# Patient Record
Sex: Female | Born: 1955 | Race: White | Hispanic: No | Marital: Married | Smoking: Never smoker
Health system: Southern US, Community
[De-identification: ages and names within clinical notes are randomized; demographics above are authoritative.]

---

## 2000-01-02 ENCOUNTER — Other Ambulatory Visit: Admission: RE | Admit: 2000-01-02 | Discharge: 2000-01-02 | Payer: Self-pay | Admitting: Family Medicine

## 2000-02-02 ENCOUNTER — Encounter: Admission: RE | Admit: 2000-02-02 | Discharge: 2000-02-02 | Payer: Self-pay | Admitting: Family Medicine

## 2000-02-02 ENCOUNTER — Encounter: Payer: Self-pay | Admitting: Family Medicine

## 2001-01-03 ENCOUNTER — Other Ambulatory Visit: Admission: RE | Admit: 2001-01-03 | Discharge: 2001-01-03 | Payer: Self-pay | Admitting: Family Medicine

## 2001-04-04 ENCOUNTER — Encounter: Payer: Self-pay | Admitting: Family Medicine

## 2001-04-04 ENCOUNTER — Encounter: Admission: RE | Admit: 2001-04-04 | Discharge: 2001-04-04 | Payer: Self-pay | Admitting: Family Medicine

## 2002-10-20 ENCOUNTER — Encounter: Admission: RE | Admit: 2002-10-20 | Discharge: 2002-10-20 | Payer: Self-pay | Admitting: Family Medicine

## 2002-10-20 ENCOUNTER — Encounter: Payer: Self-pay | Admitting: Family Medicine

## 2002-11-02 ENCOUNTER — Other Ambulatory Visit: Admission: RE | Admit: 2002-11-02 | Discharge: 2002-11-02 | Payer: Self-pay | Admitting: Family Medicine

## 2003-11-09 ENCOUNTER — Other Ambulatory Visit: Admission: RE | Admit: 2003-11-09 | Discharge: 2003-11-09 | Payer: Self-pay | Admitting: Family Medicine

## 2003-11-10 ENCOUNTER — Encounter: Admission: RE | Admit: 2003-11-10 | Discharge: 2003-11-10 | Payer: Self-pay | Admitting: Family Medicine

## 2003-12-24 ENCOUNTER — Encounter: Admission: RE | Admit: 2003-12-24 | Discharge: 2003-12-24 | Payer: Self-pay | Admitting: Family Medicine

## 2003-12-31 ENCOUNTER — Encounter: Admission: RE | Admit: 2003-12-31 | Discharge: 2003-12-31 | Payer: Self-pay | Admitting: Family Medicine

## 2004-03-03 ENCOUNTER — Ambulatory Visit (HOSPITAL_COMMUNITY): Admission: RE | Admit: 2004-03-03 | Discharge: 2004-03-03 | Payer: Self-pay | Admitting: Obstetrics & Gynecology

## 2004-03-03 ENCOUNTER — Encounter (INDEPENDENT_AMBULATORY_CARE_PROVIDER_SITE_OTHER): Payer: Self-pay | Admitting: *Deleted

## 2005-03-20 ENCOUNTER — Encounter: Admission: RE | Admit: 2005-03-20 | Discharge: 2005-03-20 | Payer: Self-pay | Admitting: Obstetrics & Gynecology

## 2005-07-30 ENCOUNTER — Other Ambulatory Visit: Admission: RE | Admit: 2005-07-30 | Discharge: 2005-07-30 | Payer: Self-pay | Admitting: Family Medicine

## 2006-05-29 ENCOUNTER — Encounter: Admission: RE | Admit: 2006-05-29 | Discharge: 2006-05-29 | Payer: Self-pay | Admitting: Family Medicine

## 2006-10-18 ENCOUNTER — Encounter: Admission: RE | Admit: 2006-10-18 | Discharge: 2006-10-18 | Payer: Self-pay | Admitting: Family Medicine

## 2007-02-27 ENCOUNTER — Other Ambulatory Visit: Admission: RE | Admit: 2007-02-27 | Discharge: 2007-02-27 | Payer: Self-pay | Admitting: Family Medicine

## 2007-07-03 ENCOUNTER — Encounter: Admission: RE | Admit: 2007-07-03 | Discharge: 2007-07-03 | Payer: Self-pay | Admitting: Family Medicine

## 2008-03-09 ENCOUNTER — Encounter: Admission: RE | Admit: 2008-03-09 | Payer: Self-pay | Admitting: Obstetrics & Gynecology

## 2008-03-09 ENCOUNTER — Encounter (INDEPENDENT_AMBULATORY_CARE_PROVIDER_SITE_OTHER): Payer: Self-pay | Admitting: Diagnostic Radiology

## 2010-04-26 ENCOUNTER — Other Ambulatory Visit: Admission: RE | Admit: 2010-04-26 | Discharge: 2010-04-26 | Payer: Self-pay | Admitting: Family Medicine

## 2010-08-05 ENCOUNTER — Encounter: Payer: Self-pay | Admitting: Family Medicine

## 2010-12-01 NOTE — Op Note (Signed)
NAME:  Lorraine Johnson, Lorraine Johnson                        ACCOUNT NO.:  [id number]   MEDICAL RECORD NO.:  [id number]                   PATIENT TYPE:  AMB   LOCATION:  [facility]C                                  FACILITY:  [facility]   PHYSICIAN:  Genia Del, M.D.             DATE OF BIRTH:  18-Jan-1956   DATE OF PROCEDURE:  03/03/2004  DATE OF DISCHARGE:                                 OPERATIVE REPORT   PREOPERATIVE DIAGNOSES:  Menorrhagia with intrauterine lesion.   POSTOPERATIVE DIAGNOSES:  1. Menorrhagia with intrauterine lesion.  2. Confirmed intrauterine lesion.  3. Probable endometrial polyp.   INTERVENTION:  Hysteroscopy with resection of intrauterine lesion,  dilatation and curettage and Novasure ablation of the endometrium.   SURGEON:  Genia Del, M.D.   ANESTHESIOLOGIST:  Raul Del, M.D.   DESCRIPTION OF PROCEDURE:  Under MAC analgesia, the patient is in lithotomy  position. She is prepped with Hibiclens on the suprapubic, vulvar and  vaginal area and draped as usual. The vaginal exam reveals and anteverted  uterus, normal volume and mobile, no adnexal mass. The speculum is entered  in the vagina, the anterior lip of the cervix is grasped with a tenaculum.  We proceed with a paracervical block with Nesacaine 1%, 20 mL total at 4 and  8 o'clock. We then make the measurements of the cervical canal and the  uterine cavity. It comes to an intrauterine cavity length of 5 cm. We then  dilate the cervix at Hegar #31 without difficulty. We introduce the  resectoscope in the intrauterine cavity with a loop. We visualize the  intrauterine cavity. A polyp measuring a little bit less than 2 cm is  present on the right posterior aspect of the uterine cavity. We resected by  cauterizing and cutting the stump of the polyp. It is removed in one piece  and sent to pathology. Hemostasis is adequate. We visualize the two ostia,  pictures are taken. We then removed the hysteroscope. We  proceed with  curettage of the endometrial cavity with a sharp curette and sent the  specimen to pathology. We remove the curette and proceed with the Novasure  ablation of the endometrium. The measurements are completed for the width  which is at 4.3 cm.  We proceed with power of 118 for 65 seconds after doing  the testing for the integrity of the cavity which has passed.  Hemostasis is  adequate at end of the intervention, all instruments are removed. The  estimated blood loss was minimal.  No complication occurred and the patient  was transferred to the recovery room in good status.                                               Genia Del, M.D.    ML/MEDQ  D:  03/03/2004  T:  03/04/2004  Job:  [id number]

## 2011-08-01 ENCOUNTER — Other Ambulatory Visit: Payer: Self-pay | Admitting: Family Medicine

## 2011-08-01 DIAGNOSIS — Z1231 Encounter for screening mammogram for malignant neoplasm of breast: Secondary | ICD-10-CM

## 2011-08-07 ENCOUNTER — Ambulatory Visit
Admission: RE | Admit: 2011-08-07 | Discharge: 2011-08-07 | Disposition: A | Discharge status: Home / Self Care | Payer: BC Managed Care – PPO | Source: Ambulatory Visit | Attending: Family Medicine | Admitting: Family Medicine

## 2011-08-07 DIAGNOSIS — Z1231 Encounter for screening mammogram for malignant neoplasm of breast: Secondary | ICD-10-CM

## 2012-10-28 ENCOUNTER — Other Ambulatory Visit: Payer: Self-pay

## 2012-10-28 DIAGNOSIS — Z1231 Encounter for screening mammogram for malignant neoplasm of breast: Secondary | ICD-10-CM

## 2012-11-19 ENCOUNTER — Ambulatory Visit: Payer: BC Managed Care – PPO

## 2012-12-09 ENCOUNTER — Ambulatory Visit
Admission: RE | Admit: 2012-12-09 | Discharge: 2012-12-09 | Disposition: A | Discharge status: Home / Self Care | Payer: BC Managed Care – PPO | Source: Ambulatory Visit

## 2012-12-09 DIAGNOSIS — Z1231 Encounter for screening mammogram for malignant neoplasm of breast: Secondary | ICD-10-CM

## 2013-08-12 ENCOUNTER — Other Ambulatory Visit (HOSPITAL_COMMUNITY)
Admission: RE | Admit: 2013-08-12 | Discharge: 2013-08-12 | Disposition: A | Discharge status: Home / Self Care | Payer: BC Managed Care – PPO | Source: Ambulatory Visit | Attending: Family Medicine | Admitting: Family Medicine

## 2013-08-12 ENCOUNTER — Other Ambulatory Visit: Payer: Self-pay | Admitting: Family Medicine

## 2013-08-12 DIAGNOSIS — Z124 Encounter for screening for malignant neoplasm of cervix: Secondary | ICD-10-CM | POA: Insufficient documentation

## 2015-06-06 ENCOUNTER — Telehealth: Payer: Self-pay | Admitting: Cardiovascular Disease

## 2015-06-06 ENCOUNTER — Encounter (HOSPITAL_COMMUNITY): Payer: Self-pay | Admitting: Emergency Medicine

## 2015-06-06 ENCOUNTER — Emergency Department (HOSPITAL_COMMUNITY): Payer: BLUE CROSS/BLUE SHIELD

## 2015-06-06 ENCOUNTER — Emergency Department (HOSPITAL_COMMUNITY)
Admission: EM | Admit: 2015-06-06 | Discharge: 2015-06-06 | Disposition: A | Discharge status: Home / Self Care | Payer: BLUE CROSS/BLUE SHIELD | Attending: Emergency Medicine | Admitting: Emergency Medicine

## 2015-06-06 DIAGNOSIS — R630 Anorexia: Secondary | ICD-10-CM | POA: Diagnosis not present

## 2015-06-06 DIAGNOSIS — G43809 Other migraine, not intractable, without status migrainosus: Secondary | ICD-10-CM | POA: Insufficient documentation

## 2015-06-06 DIAGNOSIS — R509 Fever, unspecified: Secondary | ICD-10-CM | POA: Diagnosis not present

## 2015-06-06 DIAGNOSIS — H53149 Visual discomfort, unspecified: Secondary | ICD-10-CM | POA: Diagnosis not present

## 2015-06-06 DIAGNOSIS — R112 Nausea with vomiting, unspecified: Secondary | ICD-10-CM | POA: Diagnosis present

## 2015-06-06 LAB — CBC WITH DIFFERENTIAL/PLATELET
Basophils Absolute: 0 10*3/uL (ref 0.0–0.1)
Basophils Relative: 0 %
Eosinophils Absolute: 0 10*3/uL (ref 0.0–0.7)
Eosinophils Relative: 0 %
HCT: 42.9 % (ref 36.0–46.0)
HEMOGLOBIN: 14.6 g/dL (ref 12.0–15.0)
LYMPHS ABS: 1 10*3/uL (ref 0.7–4.0)
LYMPHS PCT: 11 %
MCH: 29.6 pg (ref 26.0–34.0)
MCHC: 34 g/dL (ref 30.0–36.0)
MCV: 86.8 fL (ref 78.0–100.0)
Monocytes Absolute: 0.3 10*3/uL (ref 0.1–1.0)
Monocytes Relative: 3 %
NEUTROS ABS: 8.6 10*3/uL — AB (ref 1.7–7.7)
NEUTROS PCT: 86 %
Platelets: 357 10*3/uL (ref 150–400)
RBC: 4.94 MIL/uL (ref 3.87–5.11)
RDW: 12.1 % (ref 11.5–15.5)
WBC: 10 10*3/uL (ref 4.0–10.5)

## 2015-06-06 LAB — BASIC METABOLIC PANEL
Anion gap: 10 (ref 5–15)
BUN: 14 mg/dL (ref 6–20)
CHLORIDE: 100 mmol/L — AB (ref 101–111)
CO2: 24 mmol/L (ref 22–32)
Calcium: 9.5 mg/dL (ref 8.9–10.3)
Creatinine, Ser: 0.87 mg/dL (ref 0.44–1.00)
GFR calc Af Amer: >60 mL/min (ref >60)
GFR calc non Af Amer: >60 mL/min (ref >60)
GLUCOSE: 139 mg/dL — AB (ref 65–99)
POTASSIUM: 3.7 mmol/L (ref 3.5–5.1)
SODIUM: 134 mmol/L — AB (ref 135–145)

## 2015-06-06 MED ORDER — DIPHENHYDRAMINE HCL 50 MG/ML IJ SOLN
25.0000 mg | Freq: Once | INTRAMUSCULAR | Status: AC
  Administered 2015-06-06: 25 mg via INTRAVENOUS
  Filled 2015-06-06: qty 1

## 2015-06-06 MED ORDER — PREDNISONE 20 MG PO TABS: 60.0000 mg | ORAL_TABLET | Freq: Every day | ORAL | Status: AC

## 2015-06-06 MED ORDER — ONDANSETRON HCL 4 MG/2ML IJ SOLN
4.0000 mg | Freq: Once | INTRAMUSCULAR | Status: AC
  Administered 2015-06-06: 4 mg via INTRAVENOUS
  Filled 2015-06-06: qty 2

## 2015-06-06 MED ORDER — SODIUM CHLORIDE 0.9 % IV BOLUS (SEPSIS)
1000.0000 mL | Freq: Once | INTRAVENOUS | Status: AC
  Administered 2015-06-06: 1000 mL via INTRAVENOUS

## 2015-06-06 MED ORDER — PROCHLORPERAZINE EDISYLATE 5 MG/ML IJ SOLN
10.0000 mg | Freq: Four times a day (QID) | INTRAMUSCULAR | Status: DC | PRN
  Administered 2015-06-06: 10 mg via INTRAVENOUS
  Filled 2015-06-06: qty 2

## 2015-06-06 MED ORDER — ONDANSETRON 4 MG PO TBDP: 4.0000 mg | ORAL_TABLET | Freq: Three times a day (TID) | ORAL | Status: AC | PRN

## 2015-06-06 MED ORDER — DEXAMETHASONE SODIUM PHOSPHATE 10 MG/ML IJ SOLN
10.0000 mg | Freq: Once | INTRAMUSCULAR | Status: AC
  Administered 2015-06-06: 10 mg via INTRAVENOUS
  Filled 2015-06-06: qty 1

## 2015-06-06 NOTE — Discharge Instructions (Signed)
Follow up with your family doctor in the next couple days for further testing.  Follow up with your eye doctor for evaluation for possible temporal arteritis.  Migraine Headache A migraine headache is an intense, throbbing pain on one or both sides of your head. A migraine can last for 30 minutes to several hours. CAUSES  The exact cause of a migraine headache is not always known. However, a migraine may be caused when nerves in the brain become irritated and release chemicals that cause inflammation. This causes pain. Certain things may also trigger migraines, such as:  Alcohol.  Smoking.  Stress.  Menstruation.  Aged cheeses.  Foods or drinks that contain nitrates, glutamate, aspartame, or tyramine.  Lack of sleep.  Chocolate.  Caffeine.  Hunger.  Physical exertion.  Fatigue.  Medicines used to treat chest pain (nitroglycerine), birth control pills, estrogen, and some blood pressure medicines. SIGNS AND SYMPTOMS  Pain on one or both sides of your head.  Pulsating or throbbing pain.  Severe pain that prevents daily activities.  Pain that is aggravated by any physical activity.  Nausea, vomiting, or both.  Dizziness.  Pain with exposure to bright lights, loud noises, or activity.  General sensitivity to bright lights, loud noises, or smells. Before you get a migraine, you may get warning signs that a migraine is coming (aura). An aura may include:  Seeing flashing lights.  Seeing bright spots, halos, or zigzag lines.  Having tunnel vision or blurred vision.  Having feelings of numbness or tingling.  Having trouble talking.  Having muscle weakness. DIAGNOSIS  A migraine headache is often diagnosed based on:  Symptoms.  Physical exam.  A CT scan or MRI of your head. These imaging tests cannot diagnose migraines, but they can help rule out other causes of headaches. TREATMENT Medicines may be given for pain and nausea. Medicines can also be given to  help prevent recurrent migraines.  HOME CARE INSTRUCTIONS  Only take over-the-counter or prescription medicines for pain or discomfort as directed by your health care provider. The use of long-term narcotics is not recommended.  Lie down in a dark, quiet room when you have a migraine.  Keep a journal to find out what may trigger your migraine headaches. For example, write down:  What you eat and drink.  How much sleep you get.  Any change to your diet or medicines.  Limit alcohol consumption.  Quit smoking if you smoke.  Get 7-9 hours of sleep, or as recommended by your health care provider.  Limit stress.  Keep lights dim if bright lights bother you and make your migraines worse. SEEK IMMEDIATE MEDICAL CARE IF:   Your migraine becomes severe.  You have a fever.  You have a stiff neck.  You have vision loss.  You have muscular weakness or loss of muscle control.  You start losing your balance or have trouble walking.  You feel faint or pass out.  You have severe symptoms that are different from your first symptoms. MAKE SURE YOU:   Understand these instructions.  Will watch your condition.  Will get help right away if you are not doing well or get worse.   This information is not intended to replace advice given to you by your health care provider. Make sure you discuss any questions you have with your health care provider.   Document Released: 07/02/2005 Document Revised: 07/23/2014 Document Reviewed: 03/09/2013 Elsevier Interactive Patient Education [employer]

## 2015-06-06 NOTE — ED Notes (Signed)
Pt back from CT

## 2015-06-06 NOTE — ED Notes (Signed)
Pt woke up Sunday morning w/ a headache that she thought was a migraine.  She took medication and tried to sleep the rest of the day however became nauseas and started vomiting.  She has not been able to keep anything down for over 24 hours.  Pain is now a 5 out of 10.

## 2015-06-06 NOTE — ED Provider Notes (Signed)
CSN: [id number]     Arrival date & time 06/06/15  [id number] History   First MD Initiated Contact with Patient 06/06/15 0544     Chief Complaint  Patient presents with  . Headache  . Emesis     (Consider location/radiation/quality/duration/timing/severity/associated sxs/prior Treatment) HPI  This is a 59 year old female who presents with headache and vomiting. Patient reports onset of symptoms yesterday morning. She woke up with a headache behind her right eye. She took a pain pill which "usually knocks it out." She reports persistent pain throughout the day. She developed nonbloody, nonbilious emesis. She has not eaten anything in over 24 hours. Current headache is 5 out of 10. She denies worst headache of her life. Of note, patient reports recent trip to [place]. She states that upon her arrival to [place] 2 weeks ago she had low-grade fevers less than 101, headache, and chest congestion. This lasted for approximately 3 days. She's not had any fevers since her return on Friday. However, she does endorse chills. She denies any neck stiffness or pain. She denies any abdominal pain, chest pain, shortness of breath. She is concerned about [place]ika versus West Nile virus.  History reviewed. No pertinent past medical history. History reviewed. No pertinent past surgical history. No family history on file. Social History  Substance Use Topics  . Smoking status: Never Smoker   . Smokeless tobacco: None  . Alcohol Use: None   OB History    No data available     Review of Systems  Constitutional: Positive for chills. Negative for fever.  Eyes: Positive for photophobia. Negative for visual disturbance.  Respiratory: Negative for cough, chest tightness and shortness of breath.   Cardiovascular: Negative for chest pain.  Gastrointestinal: Positive for nausea and vomiting. Negative for abdominal pain.  Genitourinary: Negative for dysuria.  Musculoskeletal: Negative for back pain.  Neurological: Positive for  headaches. Negative for weakness and numbness.  Psychiatric/Behavioral: Negative for confusion.  All other systems reviewed and are negative.     Allergies  Review of patient's allergies indicates no known allergies.  Home Medications   Prior to Admission medications   Medication Sig Start Date End Date Taking? Authorizing Provider  predniSONE (DELTASONE) 20 MG tablet Take 3 tablets (60 mg total) by mouth daily with breakfast. 06/06/15   Shon Batonourtney F Horton, MD   BP 129/89 mmHg  Pulse 78  Temp(Src) 97.8 F (36.6 C) (Oral)  Resp 16  SpO2 100% Physical Exam  Constitutional: She is oriented to person, place, and time. She appears well-developed and well-nourished. No distress.  Eyes closed  HENT:  Head: Normocephalic and atraumatic.  Eyes: EOM are normal. Pupils are equal, round, and reactive to light.  Neck: Normal range of motion. Neck supple.  No evidence of meningismus  Cardiovascular: Normal rate, regular rhythm and normal heart sounds.   No murmur heard. Pulmonary/Chest: Effort normal and breath sounds normal. No respiratory distress. She has no wheezes.  Abdominal: Soft. Bowel sounds are normal. There is no tenderness. There is no rebound.  Neurological: She is alert and oriented to person, place, and time.  5 out of 5 strength in all 4 extremities, cranial nerves II through XII intact, no dysmetria to finger-nose-finger  Skin: Skin is warm and dry.  Psychiatric: She has a normal mood and affect.  Nursing note and vitals reviewed.   ED Course  Procedures (including critical care time) Labs Review Labs Reviewed  CBC WITH DIFFERENTIAL/PLATELET - Abnormal; Notable for the following:  Neutro Abs 8.6 (*)    All other components within normal limits  BASIC METABOLIC PANEL - Abnormal; Notable for the following:    Sodium 134 (*)    Chloride 100 (*)    Glucose, Bld 139 (*)    All other components within normal limits    Imaging Review No results found. I have  personally reviewed and evaluated these images and lab results as part of my medical decision-making.   EKG Interpretation None      MDM   Final diagnoses:  Other migraine without status migrainosus, not intractable    Patient presents with headache and vomiting. Recent travel. Afebrile. No signs or symptoms of meningismus. Low suspicion at this time for subarachnoid hemorrhage given description of headache. Patient does have some tenderness over the right temporal region which could be an indication of temporal arteritis. Intractable migraine is another consideration. Patient was given migraine cocktail including 10 mg of Decadron. On recheck she reports improvement of her symptoms. Basic labwork is reassuring and CT scan is pending. If patient improves, do not feel she needs further infectious workup with LP. If she continues to have pain over her right temporal region, she may need follow-up with ophthalmology to rule out temporal arteritis. Will initiate steroids.  Signed out to Dr. Clydene Pugh.   Shon Baton, MD 06/06/15 [phone]

## 2015-06-06 NOTE — Telephone Encounter (Signed)
Received a call from patient's husbandJorja Johnson( Tim)  this am. Lorraine Johnson has had 18 hours of severe , violent vomitting. Not able to keep anything down since Saturday night. Associated with a severe right orbital headache. No neuro deficits No fever, no diarrhea. No rash per husband. Recently returned from a trip to [place]. I advised [facility]. Sounds like she will need IV fluids , Zofran. Possible head CT for her headache if it does not resolve with hydration / other meds.    Nahser, Deloris PingPhilip J, MD  06/06/2015 5:23 AM    [facility] [address],  [address] [place], [place]  [address] Pager [phone] Phone: [phone]; Fax: [phone]   [facility]  [address] [place], [place]  [address] [phone]   Fax [phone]

## 2015-06-23 ENCOUNTER — Other Ambulatory Visit: Payer: Self-pay

## 2015-06-23 DIAGNOSIS — Z1231 Encounter for screening mammogram for malignant neoplasm of breast: Secondary | ICD-10-CM

## 2015-06-24 ENCOUNTER — Ambulatory Visit: Payer: BLUE CROSS/BLUE SHIELD

## 2015-06-24 ENCOUNTER — Ambulatory Visit
Admission: RE | Admit: 2015-06-24 | Discharge: 2015-06-24 | Disposition: A | Discharge status: Home / Self Care | Payer: BLUE CROSS/BLUE SHIELD | Source: Ambulatory Visit

## 2015-06-24 DIAGNOSIS — Z1231 Encounter for screening mammogram for malignant neoplasm of breast: Secondary | ICD-10-CM

## 2015-12-01 ENCOUNTER — Other Ambulatory Visit: Payer: Self-pay | Admitting: Family Medicine

## 2015-12-01 ENCOUNTER — Ambulatory Visit
Admission: RE | Admit: 2015-12-01 | Discharge: 2015-12-01 | Disposition: A | Discharge status: Home / Self Care | Payer: BLUE CROSS/BLUE SHIELD | Source: Ambulatory Visit | Attending: Family Medicine | Admitting: Family Medicine

## 2015-12-01 DIAGNOSIS — S60352A Superficial foreign body of left thumb, initial encounter: Secondary | ICD-10-CM

## 2017-11-19 IMAGING — CR DG FINGER THUMB 2+V*L*
1 series · 1 of 1 positions shown · non-contrast
Comparison: None.

CLINICAL DATA: 59-year-old female with shard of glass removed from
left thumb 3 weeks ago with continued pain redness and swelling.
Nail bed injury. Initial encounter.

EXAM:
LEFT THUMB 2+V

[view not recorded]
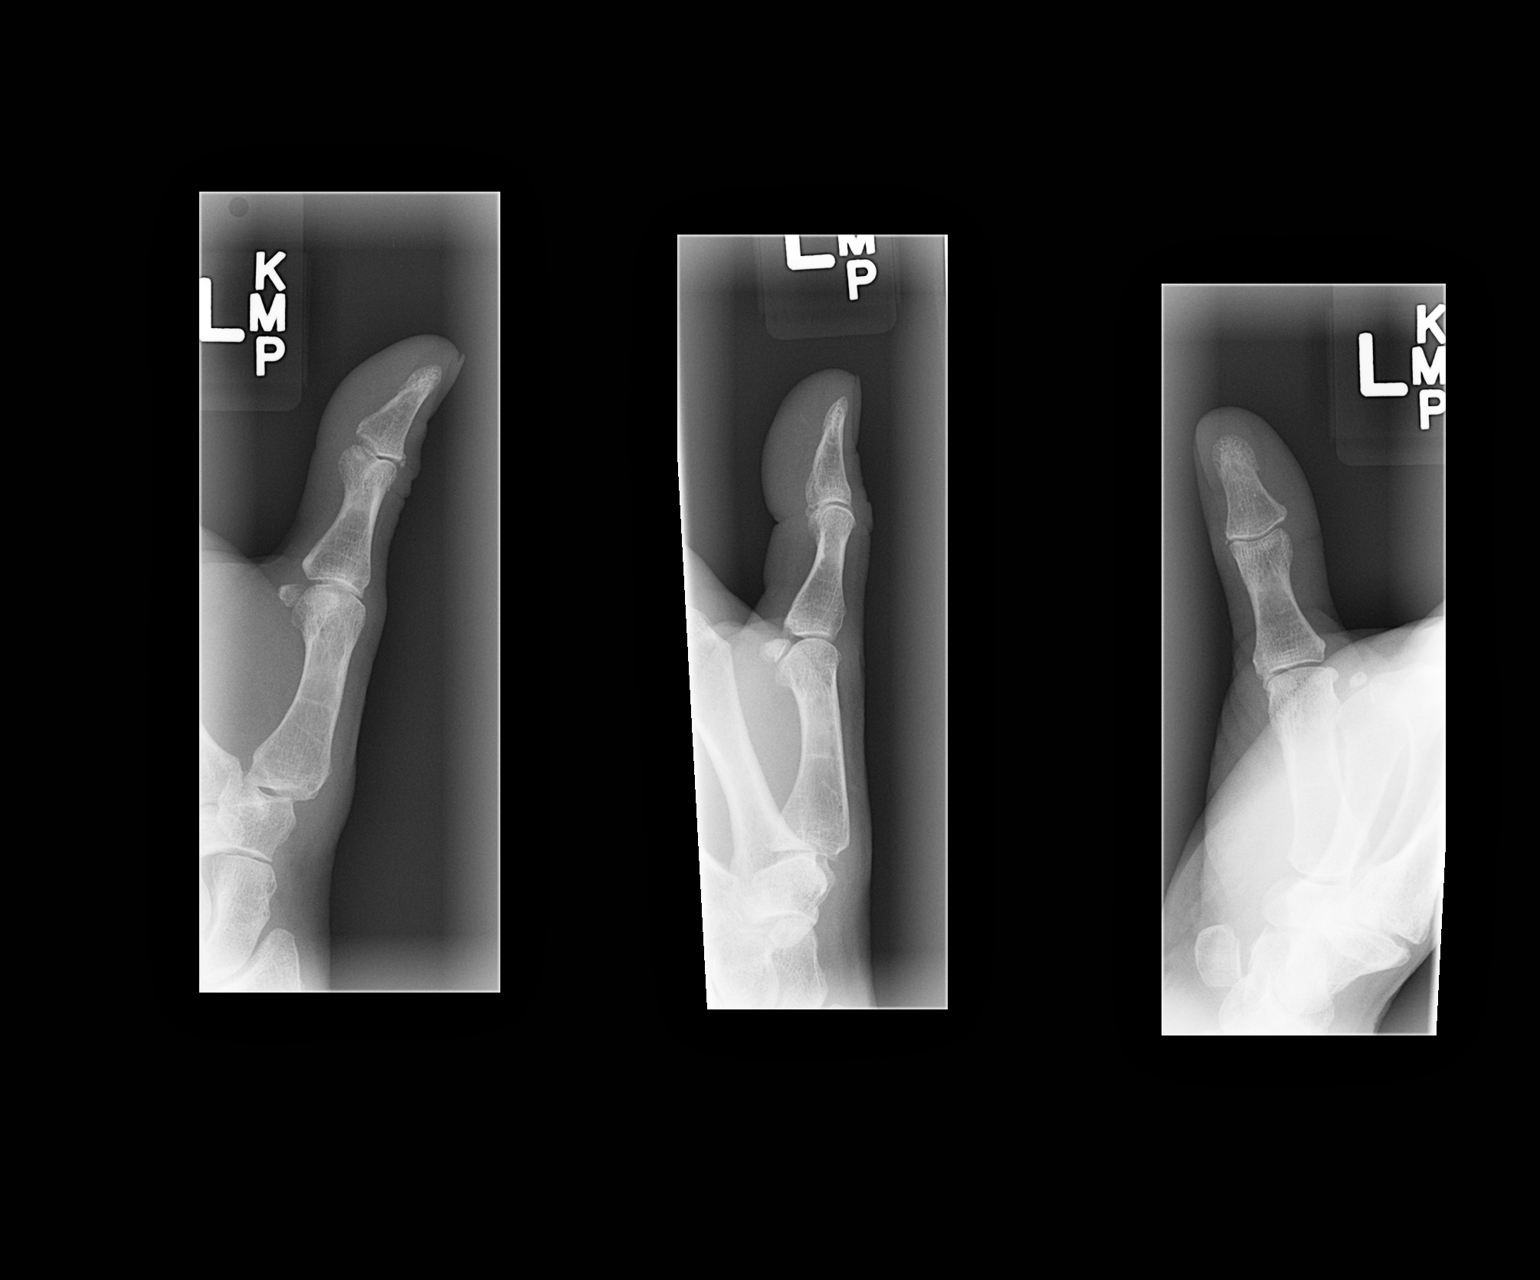

[1 of 1 positions shown; findings below may reference images not displayed]

FINDINGS: No subcutaneous gas. There is soft tissue swelling at the distal
thumb. There is a subtle linear hyperdensity (arrows on the middle
image) which could represent faint retained glass fragments - or be
artifactual. Not all glass is radiopaque.

The underlying distal phalanx appears intact. The thumb IP joint
appears normal for age. More proximal osseous structures appear
intact. There are degenerative changes at the thumb MCP joint.
IMPRESSION: 1. Questionable retained faintly radiopaque foreign body in the
volar left thumb soft tissues (arrows), with surrounding soft tissue
stranding. Note that not all glass is radiopaque.
2. No subcutaneous gas or underlying osseous abnormality associated.

## 2024-03-25 ENCOUNTER — Other Ambulatory Visit: Payer: Self-pay | Admitting: Medical Genetics

## 2024-08-20 ENCOUNTER — Other Ambulatory Visit: Payer: Self-pay | Admitting: Medical Genetics

## 2024-08-20 DIAGNOSIS — Z006 Encounter for examination for normal comparison and control in clinical research program: Secondary | ICD-10-CM
